# Patient Record
Sex: Female | Born: 1940 | Race: White | Hispanic: No | Marital: Married
Health system: Southern US, Community
[De-identification: ages and names within clinical notes are randomized; demographics above are authoritative.]

---

## 1999-10-11 ENCOUNTER — Other Ambulatory Visit: Admission: RE | Admit: 1999-10-11 | Discharge: 1999-10-11 | Payer: Self-pay | Admitting: Gynecology

## 2005-11-06 ENCOUNTER — Other Ambulatory Visit: Admission: RE | Admit: 2005-11-06 | Discharge: 2005-11-06 | Payer: Self-pay | Admitting: Gynecology

## 2012-07-16 ENCOUNTER — Other Ambulatory Visit: Payer: Self-pay | Admitting: Family Medicine

## 2012-07-17 ENCOUNTER — Other Ambulatory Visit: Payer: Self-pay | Admitting: Obstetrics & Gynecology

## 2012-07-17 DIAGNOSIS — Z780 Asymptomatic menopausal state: Secondary | ICD-10-CM

## 2012-07-17 DIAGNOSIS — Z1231 Encounter for screening mammogram for malignant neoplasm of breast: Secondary | ICD-10-CM

## 2012-09-10 ENCOUNTER — Ambulatory Visit
Admission: RE | Admit: 2012-09-10 | Discharge: 2012-09-10 | Disposition: A | Discharge status: Home / Self Care | Payer: Medicare Other | Source: Ambulatory Visit | Attending: Obstetrics & Gynecology | Admitting: Obstetrics & Gynecology

## 2012-09-10 DIAGNOSIS — Z780 Asymptomatic menopausal state: Secondary | ICD-10-CM

## 2012-09-10 DIAGNOSIS — Z1231 Encounter for screening mammogram for malignant neoplasm of breast: Secondary | ICD-10-CM

## 2014-10-12 ENCOUNTER — Other Ambulatory Visit: Payer: Self-pay | Admitting: Family Medicine

## 2014-10-12 DIAGNOSIS — M8580 Other specified disorders of bone density and structure, unspecified site: Secondary | ICD-10-CM

## 2014-10-12 DIAGNOSIS — Z1231 Encounter for screening mammogram for malignant neoplasm of breast: Secondary | ICD-10-CM

## 2014-10-12 DIAGNOSIS — T380X5A Adverse effect of glucocorticoids and synthetic analogues, initial encounter: Principal | ICD-10-CM

## 2014-11-03 ENCOUNTER — Ambulatory Visit
Admission: RE | Admit: 2014-11-03 | Discharge: 2014-11-03 | Disposition: A | Discharge status: Home / Self Care | Payer: Medicare Other | Source: Ambulatory Visit | Attending: Family Medicine | Admitting: Family Medicine

## 2014-11-03 DIAGNOSIS — Z1231 Encounter for screening mammogram for malignant neoplasm of breast: Secondary | ICD-10-CM

## 2014-11-03 DIAGNOSIS — M8580 Other specified disorders of bone density and structure, unspecified site: Secondary | ICD-10-CM

## 2014-11-03 DIAGNOSIS — T380X5A Adverse effect of glucocorticoids and synthetic analogues, initial encounter: Principal | ICD-10-CM

## 2015-12-01 DIAGNOSIS — H35363 Drusen (degenerative) of macula, bilateral: Secondary | ICD-10-CM | POA: Diagnosis not present

## 2015-12-01 DIAGNOSIS — H524 Presbyopia: Secondary | ICD-10-CM | POA: Diagnosis not present

## 2015-12-01 DIAGNOSIS — H40023 Open angle with borderline findings, high risk, bilateral: Secondary | ICD-10-CM | POA: Diagnosis not present

## 2016-11-02 DIAGNOSIS — Z1211 Encounter for screening for malignant neoplasm of colon: Secondary | ICD-10-CM | POA: Diagnosis not present

## 2016-11-02 DIAGNOSIS — N183 Chronic kidney disease, stage 3 (moderate): Secondary | ICD-10-CM | POA: Diagnosis not present

## 2016-11-02 DIAGNOSIS — E559 Vitamin D deficiency, unspecified: Secondary | ICD-10-CM | POA: Diagnosis not present

## 2016-11-02 DIAGNOSIS — I10 Essential (primary) hypertension: Secondary | ICD-10-CM | POA: Diagnosis not present

## 2016-11-02 DIAGNOSIS — L309 Dermatitis, unspecified: Secondary | ICD-10-CM | POA: Diagnosis not present

## 2016-11-02 DIAGNOSIS — M859 Disorder of bone density and structure, unspecified: Secondary | ICD-10-CM | POA: Diagnosis not present

## 2016-11-02 DIAGNOSIS — E7800 Pure hypercholesterolemia, unspecified: Secondary | ICD-10-CM | POA: Diagnosis not present

## 2016-11-02 DIAGNOSIS — Z0000 Encounter for general adult medical examination without abnormal findings: Secondary | ICD-10-CM | POA: Diagnosis not present

## 2016-11-02 DIAGNOSIS — L219 Seborrheic dermatitis, unspecified: Secondary | ICD-10-CM | POA: Diagnosis not present

## 2016-11-09 ENCOUNTER — Other Ambulatory Visit: Payer: Self-pay | Admitting: Family Medicine

## 2016-11-09 DIAGNOSIS — N183 Chronic kidney disease, stage 3 unspecified: Secondary | ICD-10-CM

## 2016-11-10 ENCOUNTER — Ambulatory Visit
Admission: RE | Admit: 2016-11-10 | Discharge: 2016-11-10 | Disposition: A | Discharge status: Home / Self Care | Payer: Medicare Other | Source: Ambulatory Visit | Attending: Family Medicine | Admitting: Family Medicine

## 2016-11-10 DIAGNOSIS — N183 Chronic kidney disease, stage 3 unspecified: Secondary | ICD-10-CM

## 2016-12-05 DIAGNOSIS — H35033 Hypertensive retinopathy, bilateral: Secondary | ICD-10-CM | POA: Diagnosis not present

## 2016-12-05 DIAGNOSIS — H524 Presbyopia: Secondary | ICD-10-CM | POA: Diagnosis not present

## 2017-08-30 DIAGNOSIS — Z23 Encounter for immunization: Secondary | ICD-10-CM | POA: Diagnosis not present

## 2017-11-12 DIAGNOSIS — I10 Essential (primary) hypertension: Secondary | ICD-10-CM | POA: Diagnosis not present

## 2017-11-12 DIAGNOSIS — Z1211 Encounter for screening for malignant neoplasm of colon: Secondary | ICD-10-CM | POA: Diagnosis not present

## 2017-11-12 DIAGNOSIS — Z1389 Encounter for screening for other disorder: Secondary | ICD-10-CM | POA: Diagnosis not present

## 2017-11-12 DIAGNOSIS — Z0000 Encounter for general adult medical examination without abnormal findings: Secondary | ICD-10-CM | POA: Diagnosis not present

## 2017-11-13 ENCOUNTER — Other Ambulatory Visit: Payer: Self-pay | Admitting: Family Medicine

## 2017-11-15 ENCOUNTER — Other Ambulatory Visit: Payer: Self-pay | Admitting: Family Medicine

## 2017-11-15 DIAGNOSIS — M8580 Other specified disorders of bone density and structure, unspecified site: Secondary | ICD-10-CM

## 2017-11-15 DIAGNOSIS — Z139 Encounter for screening, unspecified: Secondary | ICD-10-CM

## 2017-11-19 DIAGNOSIS — R739 Hyperglycemia, unspecified: Secondary | ICD-10-CM | POA: Diagnosis not present

## 2017-12-05 DIAGNOSIS — H524 Presbyopia: Secondary | ICD-10-CM | POA: Diagnosis not present

## 2017-12-05 DIAGNOSIS — H35033 Hypertensive retinopathy, bilateral: Secondary | ICD-10-CM | POA: Diagnosis not present

## 2017-12-10 ENCOUNTER — Ambulatory Visit
Admission: RE | Admit: 2017-12-10 | Discharge: 2017-12-10 | Disposition: A | Discharge status: Home / Self Care | Payer: Medicare Other | Source: Ambulatory Visit | Attending: Family Medicine | Admitting: Family Medicine

## 2017-12-10 DIAGNOSIS — Z1231 Encounter for screening mammogram for malignant neoplasm of breast: Secondary | ICD-10-CM | POA: Diagnosis not present

## 2017-12-10 DIAGNOSIS — Z780 Asymptomatic menopausal state: Secondary | ICD-10-CM | POA: Diagnosis not present

## 2017-12-10 DIAGNOSIS — M8580 Other specified disorders of bone density and structure, unspecified site: Secondary | ICD-10-CM

## 2017-12-10 DIAGNOSIS — M8589 Other specified disorders of bone density and structure, multiple sites: Secondary | ICD-10-CM | POA: Diagnosis not present

## 2017-12-10 DIAGNOSIS — Z139 Encounter for screening, unspecified: Secondary | ICD-10-CM

## 2018-08-20 DIAGNOSIS — Z23 Encounter for immunization: Secondary | ICD-10-CM | POA: Diagnosis not present

## 2018-12-09 DIAGNOSIS — E7800 Pure hypercholesterolemia, unspecified: Secondary | ICD-10-CM | POA: Diagnosis not present

## 2018-12-09 DIAGNOSIS — Z6821 Body mass index (BMI) 21.0-21.9, adult: Secondary | ICD-10-CM | POA: Diagnosis not present

## 2018-12-09 DIAGNOSIS — Z1211 Encounter for screening for malignant neoplasm of colon: Secondary | ICD-10-CM | POA: Diagnosis not present

## 2018-12-09 DIAGNOSIS — Z0000 Encounter for general adult medical examination without abnormal findings: Secondary | ICD-10-CM | POA: Diagnosis not present

## 2018-12-11 DIAGNOSIS — H35033 Hypertensive retinopathy, bilateral: Secondary | ICD-10-CM | POA: Diagnosis not present

## 2018-12-11 DIAGNOSIS — H5213 Myopia, bilateral: Secondary | ICD-10-CM | POA: Diagnosis not present

## 2019-02-18 DIAGNOSIS — H40023 Open angle with borderline findings, high risk, bilateral: Secondary | ICD-10-CM | POA: Diagnosis not present

## 2019-10-27 DIAGNOSIS — I129 Hypertensive chronic kidney disease with stage 1 through stage 4 chronic kidney disease, or unspecified chronic kidney disease: Secondary | ICD-10-CM | POA: Diagnosis not present

## 2019-10-27 DIAGNOSIS — E7800 Pure hypercholesterolemia, unspecified: Secondary | ICD-10-CM | POA: Diagnosis not present

## 2019-10-27 DIAGNOSIS — N183 Chronic kidney disease, stage 3 unspecified: Secondary | ICD-10-CM | POA: Diagnosis not present

## 2019-11-05 DIAGNOSIS — E7800 Pure hypercholesterolemia, unspecified: Secondary | ICD-10-CM | POA: Diagnosis not present

## 2019-11-05 DIAGNOSIS — I129 Hypertensive chronic kidney disease with stage 1 through stage 4 chronic kidney disease, or unspecified chronic kidney disease: Secondary | ICD-10-CM | POA: Diagnosis not present

## 2019-11-05 DIAGNOSIS — N183 Chronic kidney disease, stage 3 unspecified: Secondary | ICD-10-CM | POA: Diagnosis not present

## 2019-11-18 ENCOUNTER — Ambulatory Visit: Payer: Medicare Other | Attending: Internal Medicine

## 2019-11-18 DIAGNOSIS — Z23 Encounter for immunization: Secondary | ICD-10-CM | POA: Insufficient documentation

## 2019-11-18 NOTE — Progress Notes (Signed)
   Covid-19 Vaccination Clinic  Name:  KEBRA LOWRIMORE    MRN: [id number] DOB: 1941-09-08  11/18/2019  Ms. Amberg was observed post Covid-19 immunization for 15 minutes without incidence. She was provided with Vaccine Information Sheet and instruction to access the V-Safe system.   Ms. Menser was instructed to call 911 with any severe reactions post vaccine: Marland Kitchen Difficulty breathing  . Swelling of your face and throat  . A fast heartbeat  . A bad rash all over your body  . Dizziness and weakness    Immunizations Administered    Name Date Dose VIS Date Route   Pfizer COVID-19 Vaccine 11/18/2019  6:13 PM 0.3 mL 10/10/2019 Intramuscular   Manufacturer: [employer], [employer]   Lot: [id number]   NDC: [address]-2

## 2019-11-26 ENCOUNTER — Ambulatory Visit: Payer: Self-pay

## 2019-12-05 ENCOUNTER — Ambulatory Visit: Payer: Self-pay

## 2019-12-07 ENCOUNTER — Ambulatory Visit: Payer: Medicare Other | Attending: Internal Medicine

## 2019-12-07 DIAGNOSIS — Z23 Encounter for immunization: Secondary | ICD-10-CM | POA: Insufficient documentation

## 2019-12-07 NOTE — Progress Notes (Signed)
   Covid-19 Vaccination Clinic  Name:  CAYLEN KUWAHARA    MRN: [id number] DOB: 02/01/1941  12/07/2019  Ms. Abdo was observed post Covid-19 immunization for 15 minutes without incidence. She was provided with Vaccine Information Sheet and instruction to access the V-Safe system.   Ms. Admire was instructed to call 911 with any severe reactions post vaccine: Marland Kitchen Difficulty breathing  . Swelling of your face and throat  . A fast heartbeat  . A bad rash all over your body  . Dizziness and weakness    Immunizations Administered    Name Date Dose VIS Date Route   Pfizer COVID-19 Vaccine 12/07/2019 11:24 AM 0.3 mL 10/10/2019 Intramuscular   Manufacturer: [employer], [employer]   Lot: [id number]   NDC: [address]-5

## 2019-12-12 ENCOUNTER — Other Ambulatory Visit: Payer: Self-pay | Admitting: Family Medicine

## 2019-12-12 DIAGNOSIS — N183 Chronic kidney disease, stage 3 unspecified: Secondary | ICD-10-CM | POA: Diagnosis not present

## 2019-12-12 DIAGNOSIS — E7800 Pure hypercholesterolemia, unspecified: Secondary | ICD-10-CM | POA: Diagnosis not present

## 2019-12-12 DIAGNOSIS — R221 Localized swelling, mass and lump, neck: Secondary | ICD-10-CM

## 2019-12-12 DIAGNOSIS — I129 Hypertensive chronic kidney disease with stage 1 through stage 4 chronic kidney disease, or unspecified chronic kidney disease: Secondary | ICD-10-CM | POA: Diagnosis not present

## 2019-12-12 DIAGNOSIS — Z0000 Encounter for general adult medical examination without abnormal findings: Secondary | ICD-10-CM | POA: Diagnosis not present

## 2019-12-15 DIAGNOSIS — Z1211 Encounter for screening for malignant neoplasm of colon: Secondary | ICD-10-CM | POA: Diagnosis not present

## 2019-12-16 ENCOUNTER — Ambulatory Visit
Admission: RE | Admit: 2019-12-16 | Discharge: 2019-12-16 | Disposition: A | Discharge status: Home / Self Care | Payer: Medicare Other | Source: Ambulatory Visit | Attending: Family Medicine | Admitting: Family Medicine

## 2019-12-16 ENCOUNTER — Other Ambulatory Visit: Payer: Self-pay

## 2019-12-16 DIAGNOSIS — R221 Localized swelling, mass and lump, neck: Secondary | ICD-10-CM

## 2019-12-16 DIAGNOSIS — K115 Sialolithiasis: Secondary | ICD-10-CM | POA: Diagnosis not present

## 2019-12-16 IMAGING — CT CT NECK W/ CM
2 of 4 series · 5 of 14 positions shown, 6 images · IV contrast (iopamidol)
Comparison: None.

CLINICAL DATA: Left lateral neck mass

EXAM:
CT NECK WITH CONTRAST
TECHNIQUE: Multidetector CT imaging of the neck was performed using the
standard protocol following the bolus administration of intravenous
contrast.
CONTRAST:  60mL [U4] IOPAMIDOL ([U4]) INJECTION 61%
Creatinine was obtained on site at [HOSPITAL] at [HOSPITAL].
Results: Creatinine 1.3 mg/dL.

[Series 3: neck · axial · 0.48mm/px · z∈[-244,-164]mm · 2 of 121 slices shown]
[im 41/121  bone]
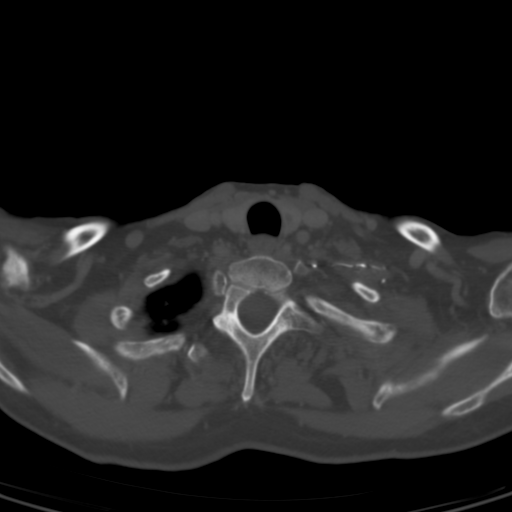
[im 81/121  bone]
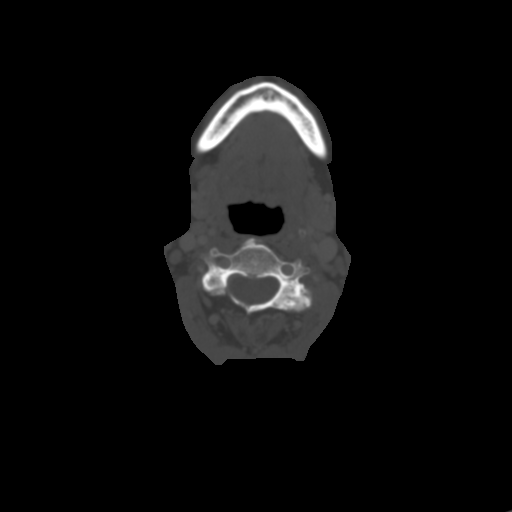

[Series 8: angled axial-oropharynx · axial · 0.46mm/px · z∈[-278,-159]mm · 3 of 122 slices shown, 4 images]
[im 31/122  soft-tissue]
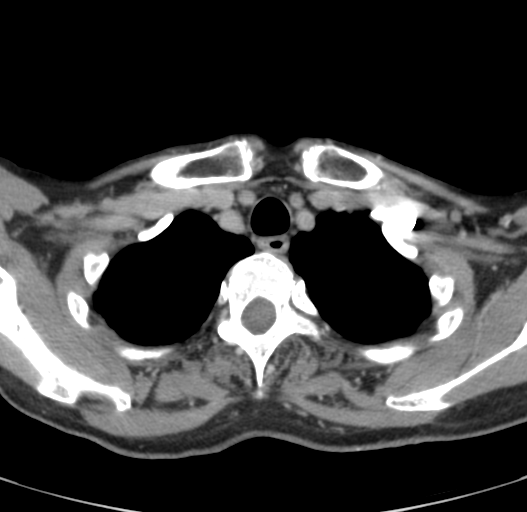
[im 31/122  bone]
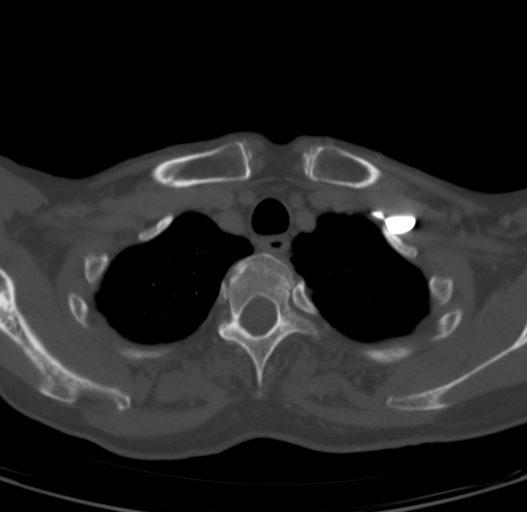
[im 61/122  bone]
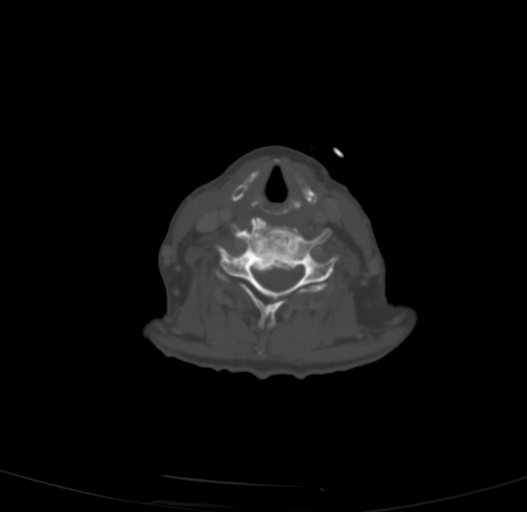
[im 91/122  bone]
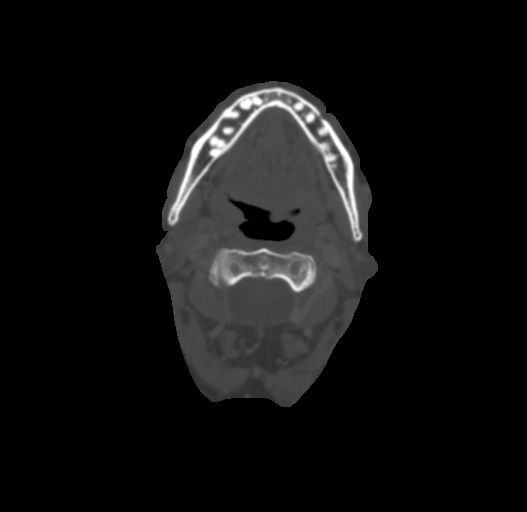

[5 of 14 positions shown; findings below may reference images not displayed]

FINDINGS: Pharynx and larynx: Unremarkable.  No mass or swelling.

Salivary glands: There is a punctate calculus of the right
submandibular gland. Left submandibular gland is unremarkable. There
is a 9 mm rounded calcification at the level of the inferior left
parotid gland. Parotids are otherwise unremarkable.

Thyroid: Small subcentimeter nodules bilaterally. No further
follow-up is required.

Lymph nodes: There are no pathologically enlarged or otherwise
abnormal lymph nodes.

Vascular: Major neck vessels are patent.

Limited intracranial: No abnormal enhancement.

Visualized orbits: Not included.

Mastoids and visualized paranasal sinuses: Aerated.

Skeleton: Multilevel degenerative changes of the cervical spine.

Upper chest: Lungs are clear.

Other: None
IMPRESSION: 9 mm calcification at the level of the inferior left parotid gland.
This is favored to be within the gland and therefore reflect a
calculus. Unclear if this reflects reported palpable mass.

Punctate calculus of the right submandibular gland.

## 2019-12-16 MED ORDER — IOPAMIDOL (ISOVUE-300) INJECTION 61%
75.0000 mL | Freq: Once | INTRAVENOUS | Status: AC | PRN
  Administered 2019-12-16: 60 mL via INTRAVENOUS

## 2019-12-17 ENCOUNTER — Other Ambulatory Visit: Payer: Self-pay | Admitting: Family Medicine

## 2019-12-17 DIAGNOSIS — Z1231 Encounter for screening mammogram for malignant neoplasm of breast: Secondary | ICD-10-CM

## 2019-12-17 DIAGNOSIS — M8580 Other specified disorders of bone density and structure, unspecified site: Secondary | ICD-10-CM

## 2020-01-05 DIAGNOSIS — H35033 Hypertensive retinopathy, bilateral: Secondary | ICD-10-CM | POA: Diagnosis not present

## 2020-01-05 DIAGNOSIS — H5203 Hypermetropia, bilateral: Secondary | ICD-10-CM | POA: Diagnosis not present

## 2020-01-28 ENCOUNTER — Ambulatory Visit
Admission: RE | Admit: 2020-01-28 | Discharge: 2020-01-28 | Disposition: A | Discharge status: Home / Self Care | Payer: Medicare Other | Source: Ambulatory Visit | Attending: Family Medicine | Admitting: Family Medicine

## 2020-01-28 ENCOUNTER — Other Ambulatory Visit: Payer: Self-pay

## 2020-01-28 DIAGNOSIS — Z780 Asymptomatic menopausal state: Secondary | ICD-10-CM | POA: Diagnosis not present

## 2020-01-28 DIAGNOSIS — M8580 Other specified disorders of bone density and structure, unspecified site: Secondary | ICD-10-CM

## 2020-01-28 DIAGNOSIS — Z1231 Encounter for screening mammogram for malignant neoplasm of breast: Secondary | ICD-10-CM | POA: Diagnosis not present

## 2020-01-28 DIAGNOSIS — M8589 Other specified disorders of bone density and structure, multiple sites: Secondary | ICD-10-CM | POA: Diagnosis not present

## 2020-01-29 ENCOUNTER — Other Ambulatory Visit: Payer: Self-pay | Admitting: Family Medicine

## 2020-01-29 DIAGNOSIS — R928 Other abnormal and inconclusive findings on diagnostic imaging of breast: Secondary | ICD-10-CM

## 2020-02-04 ENCOUNTER — Ambulatory Visit
Admission: RE | Admit: 2020-02-04 | Discharge: 2020-02-04 | Disposition: A | Discharge status: Home / Self Care | Payer: Medicare Other | Source: Ambulatory Visit | Attending: Family Medicine | Admitting: Family Medicine

## 2020-02-04 ENCOUNTER — Other Ambulatory Visit: Payer: Self-pay

## 2020-02-04 ENCOUNTER — Other Ambulatory Visit: Payer: Self-pay | Admitting: Family Medicine

## 2020-02-04 DIAGNOSIS — N6489 Other specified disorders of breast: Secondary | ICD-10-CM | POA: Diagnosis not present

## 2020-02-04 DIAGNOSIS — R928 Other abnormal and inconclusive findings on diagnostic imaging of breast: Secondary | ICD-10-CM

## 2020-05-07 DIAGNOSIS — I129 Hypertensive chronic kidney disease with stage 1 through stage 4 chronic kidney disease, or unspecified chronic kidney disease: Secondary | ICD-10-CM | POA: Diagnosis not present

## 2020-05-07 DIAGNOSIS — E7800 Pure hypercholesterolemia, unspecified: Secondary | ICD-10-CM | POA: Diagnosis not present

## 2020-05-07 DIAGNOSIS — N183 Chronic kidney disease, stage 3 unspecified: Secondary | ICD-10-CM | POA: Diagnosis not present

## 2020-10-14 DIAGNOSIS — E7800 Pure hypercholesterolemia, unspecified: Secondary | ICD-10-CM | POA: Diagnosis not present

## 2020-10-14 DIAGNOSIS — N183 Chronic kidney disease, stage 3 unspecified: Secondary | ICD-10-CM | POA: Diagnosis not present

## 2020-10-14 DIAGNOSIS — I129 Hypertensive chronic kidney disease with stage 1 through stage 4 chronic kidney disease, or unspecified chronic kidney disease: Secondary | ICD-10-CM | POA: Diagnosis not present

## 2020-12-14 DIAGNOSIS — I129 Hypertensive chronic kidney disease with stage 1 through stage 4 chronic kidney disease, or unspecified chronic kidney disease: Secondary | ICD-10-CM | POA: Diagnosis not present

## 2020-12-14 DIAGNOSIS — Z1211 Encounter for screening for malignant neoplasm of colon: Secondary | ICD-10-CM | POA: Diagnosis not present

## 2020-12-14 DIAGNOSIS — Z0000 Encounter for general adult medical examination without abnormal findings: Secondary | ICD-10-CM | POA: Diagnosis not present

## 2020-12-14 DIAGNOSIS — Z6820 Body mass index (BMI) 20.0-20.9, adult: Secondary | ICD-10-CM | POA: Diagnosis not present

## 2020-12-16 DIAGNOSIS — M170 Bilateral primary osteoarthritis of knee: Secondary | ICD-10-CM | POA: Diagnosis not present

## 2020-12-17 DIAGNOSIS — Z1211 Encounter for screening for malignant neoplasm of colon: Secondary | ICD-10-CM | POA: Diagnosis not present

## 2020-12-30 DIAGNOSIS — E7800 Pure hypercholesterolemia, unspecified: Secondary | ICD-10-CM | POA: Diagnosis not present

## 2020-12-30 DIAGNOSIS — M170 Bilateral primary osteoarthritis of knee: Secondary | ICD-10-CM | POA: Diagnosis not present

## 2020-12-30 DIAGNOSIS — I129 Hypertensive chronic kidney disease with stage 1 through stage 4 chronic kidney disease, or unspecified chronic kidney disease: Secondary | ICD-10-CM | POA: Diagnosis not present

## 2021-01-18 DIAGNOSIS — M25562 Pain in left knee: Secondary | ICD-10-CM | POA: Diagnosis not present

## 2021-01-18 DIAGNOSIS — M79605 Pain in left leg: Secondary | ICD-10-CM | POA: Diagnosis not present

## 2021-01-18 DIAGNOSIS — M79604 Pain in right leg: Secondary | ICD-10-CM | POA: Diagnosis not present

## 2021-01-18 DIAGNOSIS — M25561 Pain in right knee: Secondary | ICD-10-CM | POA: Diagnosis not present

## 2021-02-07 DIAGNOSIS — E7800 Pure hypercholesterolemia, unspecified: Secondary | ICD-10-CM | POA: Diagnosis not present

## 2021-02-07 DIAGNOSIS — M170 Bilateral primary osteoarthritis of knee: Secondary | ICD-10-CM | POA: Diagnosis not present

## 2021-02-07 DIAGNOSIS — N183 Chronic kidney disease, stage 3 unspecified: Secondary | ICD-10-CM | POA: Diagnosis not present

## 2021-02-07 DIAGNOSIS — I129 Hypertensive chronic kidney disease with stage 1 through stage 4 chronic kidney disease, or unspecified chronic kidney disease: Secondary | ICD-10-CM | POA: Diagnosis not present

## 2021-05-19 DIAGNOSIS — I129 Hypertensive chronic kidney disease with stage 1 through stage 4 chronic kidney disease, or unspecified chronic kidney disease: Secondary | ICD-10-CM | POA: Diagnosis not present

## 2021-05-19 DIAGNOSIS — E7800 Pure hypercholesterolemia, unspecified: Secondary | ICD-10-CM | POA: Diagnosis not present

## 2021-05-19 DIAGNOSIS — N183 Chronic kidney disease, stage 3 unspecified: Secondary | ICD-10-CM | POA: Diagnosis not present

## 2021-05-19 DIAGNOSIS — M170 Bilateral primary osteoarthritis of knee: Secondary | ICD-10-CM | POA: Diagnosis not present

## 2021-09-27 DIAGNOSIS — M1711 Unilateral primary osteoarthritis, right knee: Secondary | ICD-10-CM | POA: Diagnosis not present

## 2021-12-21 ENCOUNTER — Other Ambulatory Visit: Payer: Self-pay | Admitting: Family Medicine

## 2021-12-21 DIAGNOSIS — N183 Chronic kidney disease, stage 3 unspecified: Secondary | ICD-10-CM | POA: Diagnosis not present

## 2021-12-21 DIAGNOSIS — R7303 Prediabetes: Secondary | ICD-10-CM | POA: Diagnosis not present

## 2021-12-21 DIAGNOSIS — H9190 Unspecified hearing loss, unspecified ear: Secondary | ICD-10-CM | POA: Diagnosis not present

## 2021-12-21 DIAGNOSIS — M79605 Pain in left leg: Secondary | ICD-10-CM | POA: Diagnosis not present

## 2021-12-21 DIAGNOSIS — M859 Disorder of bone density and structure, unspecified: Secondary | ICD-10-CM | POA: Diagnosis not present

## 2021-12-21 DIAGNOSIS — I129 Hypertensive chronic kidney disease with stage 1 through stage 4 chronic kidney disease, or unspecified chronic kidney disease: Secondary | ICD-10-CM | POA: Diagnosis not present

## 2021-12-21 DIAGNOSIS — M170 Bilateral primary osteoarthritis of knee: Secondary | ICD-10-CM | POA: Diagnosis not present

## 2021-12-21 DIAGNOSIS — M79604 Pain in right leg: Secondary | ICD-10-CM | POA: Diagnosis not present

## 2021-12-21 DIAGNOSIS — E7800 Pure hypercholesterolemia, unspecified: Secondary | ICD-10-CM | POA: Diagnosis not present

## 2021-12-21 DIAGNOSIS — E559 Vitamin D deficiency, unspecified: Secondary | ICD-10-CM | POA: Diagnosis not present

## 2021-12-21 DIAGNOSIS — Z0000 Encounter for general adult medical examination without abnormal findings: Secondary | ICD-10-CM | POA: Diagnosis not present

## 2021-12-27 DIAGNOSIS — Z1211 Encounter for screening for malignant neoplasm of colon: Secondary | ICD-10-CM | POA: Diagnosis not present

## 2022-01-04 ENCOUNTER — Other Ambulatory Visit: Payer: Self-pay | Admitting: Family Medicine

## 2022-01-04 DIAGNOSIS — Z1231 Encounter for screening mammogram for malignant neoplasm of breast: Secondary | ICD-10-CM

## 2022-01-23 DIAGNOSIS — M25561 Pain in right knee: Secondary | ICD-10-CM | POA: Diagnosis not present

## 2022-02-28 DIAGNOSIS — H35363 Drusen (degenerative) of macula, bilateral: Secondary | ICD-10-CM | POA: Diagnosis not present

## 2022-06-13 ENCOUNTER — Ambulatory Visit
Admission: RE | Admit: 2022-06-13 | Discharge: 2022-06-13 | Disposition: A | Discharge status: Home / Self Care | Payer: Medicare Other | Source: Ambulatory Visit | Attending: Family Medicine | Admitting: Family Medicine

## 2022-06-13 DIAGNOSIS — M859 Disorder of bone density and structure, unspecified: Secondary | ICD-10-CM

## 2022-06-13 DIAGNOSIS — Z1231 Encounter for screening mammogram for malignant neoplasm of breast: Secondary | ICD-10-CM | POA: Diagnosis not present

## 2022-06-13 DIAGNOSIS — Z780 Asymptomatic menopausal state: Secondary | ICD-10-CM | POA: Diagnosis not present

## 2022-06-13 DIAGNOSIS — M8589 Other specified disorders of bone density and structure, multiple sites: Secondary | ICD-10-CM | POA: Diagnosis not present

## 2022-08-09 DIAGNOSIS — M1711 Unilateral primary osteoarthritis, right knee: Secondary | ICD-10-CM | POA: Diagnosis not present

## 2023-01-09 DIAGNOSIS — Z23 Encounter for immunization: Secondary | ICD-10-CM | POA: Diagnosis not present

## 2023-01-09 DIAGNOSIS — Z0000 Encounter for general adult medical examination without abnormal findings: Secondary | ICD-10-CM | POA: Diagnosis not present

## 2023-01-16 DIAGNOSIS — I129 Hypertensive chronic kidney disease with stage 1 through stage 4 chronic kidney disease, or unspecified chronic kidney disease: Secondary | ICD-10-CM | POA: Diagnosis not present

## 2023-01-16 DIAGNOSIS — E559 Vitamin D deficiency, unspecified: Secondary | ICD-10-CM | POA: Diagnosis not present

## 2023-01-16 DIAGNOSIS — Z23 Encounter for immunization: Secondary | ICD-10-CM | POA: Diagnosis not present

## 2023-01-16 DIAGNOSIS — R7303 Prediabetes: Secondary | ICD-10-CM | POA: Diagnosis not present

## 2023-01-16 DIAGNOSIS — E7800 Pure hypercholesterolemia, unspecified: Secondary | ICD-10-CM | POA: Diagnosis not present

## 2023-01-16 DIAGNOSIS — N1831 Chronic kidney disease, stage 3a: Secondary | ICD-10-CM | POA: Diagnosis not present

## 2023-01-16 DIAGNOSIS — M859 Disorder of bone density and structure, unspecified: Secondary | ICD-10-CM | POA: Diagnosis not present

## 2023-01-24 DIAGNOSIS — M1711 Unilateral primary osteoarthritis, right knee: Secondary | ICD-10-CM | POA: Diagnosis not present

## 2023-03-05 DIAGNOSIS — H35033 Hypertensive retinopathy, bilateral: Secondary | ICD-10-CM | POA: Diagnosis not present

## 2023-03-06 DIAGNOSIS — E7800 Pure hypercholesterolemia, unspecified: Secondary | ICD-10-CM | POA: Diagnosis not present

## 2023-03-06 DIAGNOSIS — R7303 Prediabetes: Secondary | ICD-10-CM | POA: Diagnosis not present

## 2023-03-06 DIAGNOSIS — I129 Hypertensive chronic kidney disease with stage 1 through stage 4 chronic kidney disease, or unspecified chronic kidney disease: Secondary | ICD-10-CM | POA: Diagnosis not present

## 2023-03-06 DIAGNOSIS — N1831 Chronic kidney disease, stage 3a: Secondary | ICD-10-CM | POA: Diagnosis not present

## 2023-03-06 DIAGNOSIS — M859 Disorder of bone density and structure, unspecified: Secondary | ICD-10-CM | POA: Diagnosis not present

## 2023-07-17 DIAGNOSIS — G8929 Other chronic pain: Secondary | ICD-10-CM | POA: Diagnosis not present

## 2023-07-17 DIAGNOSIS — M25562 Pain in left knee: Secondary | ICD-10-CM | POA: Diagnosis not present

## 2023-07-17 DIAGNOSIS — M25561 Pain in right knee: Secondary | ICD-10-CM | POA: Diagnosis not present

## 2023-08-03 DIAGNOSIS — M170 Bilateral primary osteoarthritis of knee: Secondary | ICD-10-CM | POA: Diagnosis not present

## 2023-08-17 DIAGNOSIS — M170 Bilateral primary osteoarthritis of knee: Secondary | ICD-10-CM | POA: Diagnosis not present

## 2023-08-24 DIAGNOSIS — M170 Bilateral primary osteoarthritis of knee: Secondary | ICD-10-CM | POA: Diagnosis not present

## 2023-08-29 DIAGNOSIS — M170 Bilateral primary osteoarthritis of knee: Secondary | ICD-10-CM | POA: Diagnosis not present

## 2024-01-10 DIAGNOSIS — Z0000 Encounter for general adult medical examination without abnormal findings: Secondary | ICD-10-CM | POA: Diagnosis not present

## 2024-01-15 DIAGNOSIS — Z6820 Body mass index (BMI) 20.0-20.9, adult: Secondary | ICD-10-CM | POA: Diagnosis not present

## 2024-01-15 DIAGNOSIS — R7303 Prediabetes: Secondary | ICD-10-CM | POA: Diagnosis not present

## 2024-01-15 DIAGNOSIS — M859 Disorder of bone density and structure, unspecified: Secondary | ICD-10-CM | POA: Diagnosis not present

## 2024-01-15 DIAGNOSIS — N1831 Chronic kidney disease, stage 3a: Secondary | ICD-10-CM | POA: Diagnosis not present

## 2024-01-15 DIAGNOSIS — I129 Hypertensive chronic kidney disease with stage 1 through stage 4 chronic kidney disease, or unspecified chronic kidney disease: Secondary | ICD-10-CM | POA: Diagnosis not present

## 2024-01-15 DIAGNOSIS — E7800 Pure hypercholesterolemia, unspecified: Secondary | ICD-10-CM | POA: Diagnosis not present

## 2024-01-15 DIAGNOSIS — M1711 Unilateral primary osteoarthritis, right knee: Secondary | ICD-10-CM | POA: Diagnosis not present

## 2024-01-18 ENCOUNTER — Encounter: Payer: Self-pay | Admitting: Family Medicine

## 2024-01-18 ENCOUNTER — Ambulatory Visit: Admitting: Family Medicine

## 2024-01-18 VITALS — BP 122/86 | Ht 65.0 in | Wt 115.0 lb

## 2024-01-18 DIAGNOSIS — M1711 Unilateral primary osteoarthritis, right knee: Secondary | ICD-10-CM | POA: Diagnosis not present

## 2024-01-18 MED ORDER — METHYLPREDNISOLONE ACETATE 40 MG/ML IJ SUSP
40.0000 mg | Freq: Once | INTRAMUSCULAR | Status: AC
  Administered 2024-01-18: 40 mg via INTRA_ARTICULAR

## 2024-01-18 NOTE — Progress Notes (Signed)
 PCP: Sigmund Hazel, MD  Chief Complaint: right knee pain Subjective:   HPI: Patient is a 83 y.o. female here for right knee pain.  Patient used to see Dr. Lyn Hollingshead already [facility] physician and has had steroid injections by him in the past.  Patient notes that she would get good relief from the steroid injections sometimes lasting approximately 6 months.  Patient states that she recently saw a new physician who recommended physical therapy instead of steroid injections.  Patient states that she tried to do physical therapy but the pain was significant and she felt bedridden for a few days afterwards.  Patient states that she likes to be very active and played tennis as well as walking but sometimes the pain is so severe she cannot walk.  Patient has no plans on having surgery anytime soon..    No past medical history on file.  No current outpatient medications on file prior to visit.   No current facility-administered medications on file prior to visit.    No past surgical history on file.  No Known Allergies  BP 122/86   Ht 5\' 5"  (1.651 m)   Wt 115 lb (52.2 kg)   BMI 19.14 kg/m       No data to display              No data to display             Objective:  Physical Exam:  Gen: NAD, comfortable in exam room  Bilateral knee inspection reveals some bony abnormalities of the patella.  Minimal patellar gliding noted.  There is no tenderness to palpation of the left knee, mild tenderness to palpation of the right knee along the medial lateral joint lines.  Range of motion is full with flexion extension.  Strength is 5 out of 5 with flexion against resistance and extension against resistance. Varus/valgus: Negative McMurray: Negative   Assessment & Plan:  1. 1. Primary osteoarthritis of right knee (Primary) - Patient likely dealing with primary osteoarthritic changes of the right knee.  Did discuss with patient that she would benefit from continuing to do weightbearing  exercises as tolerated such as walking and even tennis.  Will go ahead and provide steroid injection today so patient may have some pain relief and hopefully restart these activities.  Did also recommend that patient start using a compression sleeve.  Patient was provided with bilateral body helix's today.  Advised patient to use these whenever she is doing any activities to provide both stability and compression.  Patient understanding and agreeable plan.  Patient to follow-up as needed.  Aspiration/Injection Procedure Note Kristin Burton 03-Jan-1941  Procedure: Large Joint Aspiration / Injection of Knee Indications: Pain  Procedure Details Patient written consent to procedure. Risks, benefits, and alternatives explained. Sterilely prepped with alcohol. Ethyl cholride used for anesthesia.  3 cc of lidocaine was then injected into the joint space followed by 4 cc Lidocaine 1% mixed with 1 mL of DEPO-MEDROL 40 mg injected using the superolateral approach without difficulty. No complications with procedure and tolerated well.   Brenton Grills MD, PGY-4  Sports Medicine Fellow [facility]

## 2024-03-10 DIAGNOSIS — H524 Presbyopia: Secondary | ICD-10-CM | POA: Diagnosis not present

## 2024-03-10 DIAGNOSIS — H35363 Drusen (degenerative) of macula, bilateral: Secondary | ICD-10-CM | POA: Diagnosis not present

## 2024-03-10 DIAGNOSIS — H40023 Open angle with borderline findings, high risk, bilateral: Secondary | ICD-10-CM | POA: Diagnosis not present
